# Patient Record
Sex: Male | Born: 1975 | Race: White | Hispanic: No | Marital: Single | Smoking: Current every day smoker
Health system: Southern US, Community
[De-identification: ages and names within clinical notes are randomized; demographics above are authoritative.]

## PROBLEM LIST (undated history)

## (undated) DIAGNOSIS — J45909 Unspecified asthma, uncomplicated: Secondary | ICD-10-CM

## (undated) DIAGNOSIS — M1990 Unspecified osteoarthritis, unspecified site: Secondary | ICD-10-CM

## (undated) DIAGNOSIS — J449 Chronic obstructive pulmonary disease, unspecified: Secondary | ICD-10-CM

## (undated) DIAGNOSIS — M5430 Sciatica, unspecified side: Secondary | ICD-10-CM

---

## 2004-01-15 ENCOUNTER — Emergency Department: Payer: Self-pay | Admitting: Emergency Medicine

## 2004-04-02 ENCOUNTER — Emergency Department: Payer: Self-pay | Admitting: Emergency Medicine

## 2004-06-30 ENCOUNTER — Emergency Department: Payer: Self-pay | Admitting: Emergency Medicine

## 2004-09-04 ENCOUNTER — Emergency Department: Payer: Self-pay | Admitting: Emergency Medicine

## 2007-01-14 IMAGING — CR DG CHEST 2V
1 series · 2 of 2 positions shown · non-contrast
Comparison: none

REASON FOR EXAM: cough  pt in rm 17
COMMENTS:

PROCEDURE:     DXR - DXR CHEST PA (OR AP) AND LATERAL  - September 04, 2004 [DATE]
RESULT:     PA and lateral view reveals the cardiomediastinal structures to
be within normal limits.  The lung fields are clear. Vascularity is within
normal limits.  No effusions or pneumothoraces are noted.

[Series 355: postero_anterior · 0.11mm/px · 2 of 2 slices shown]
[im 1/2]
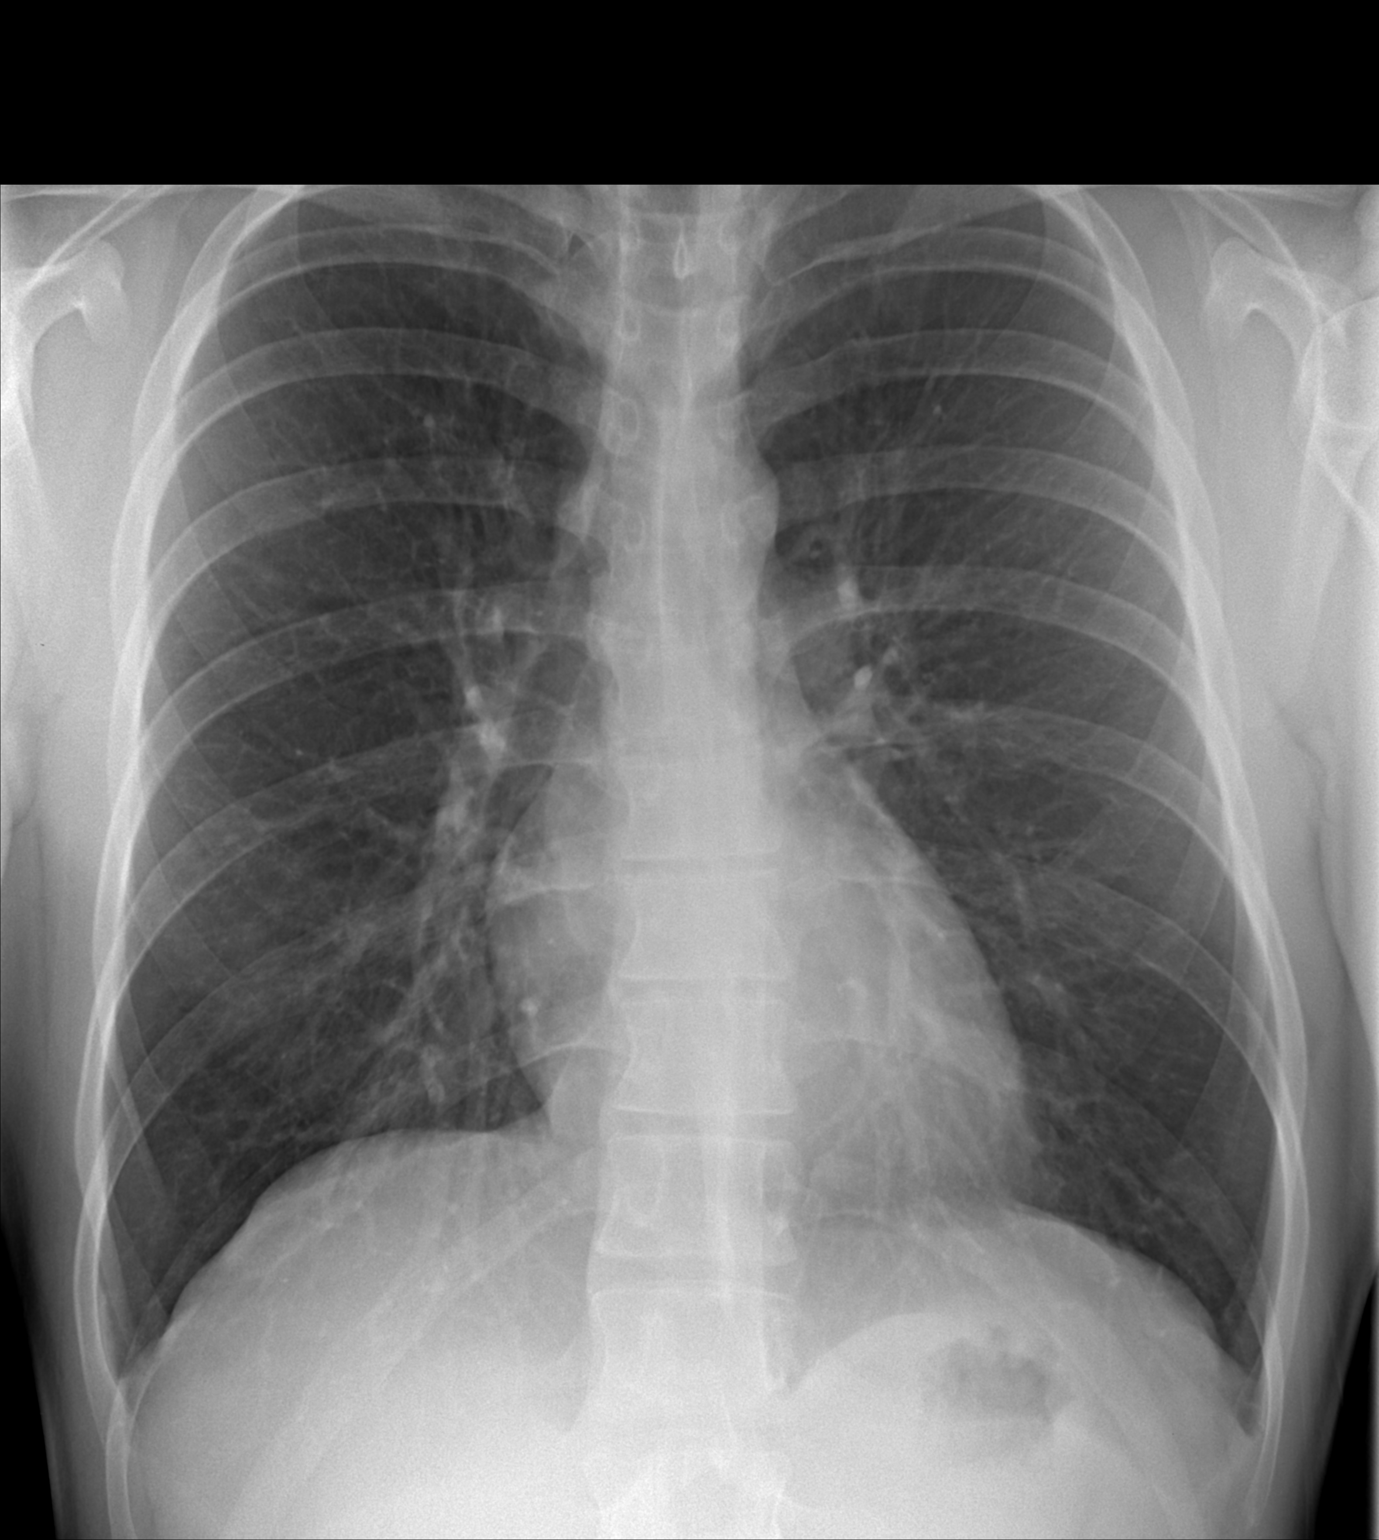
[im 2/2]
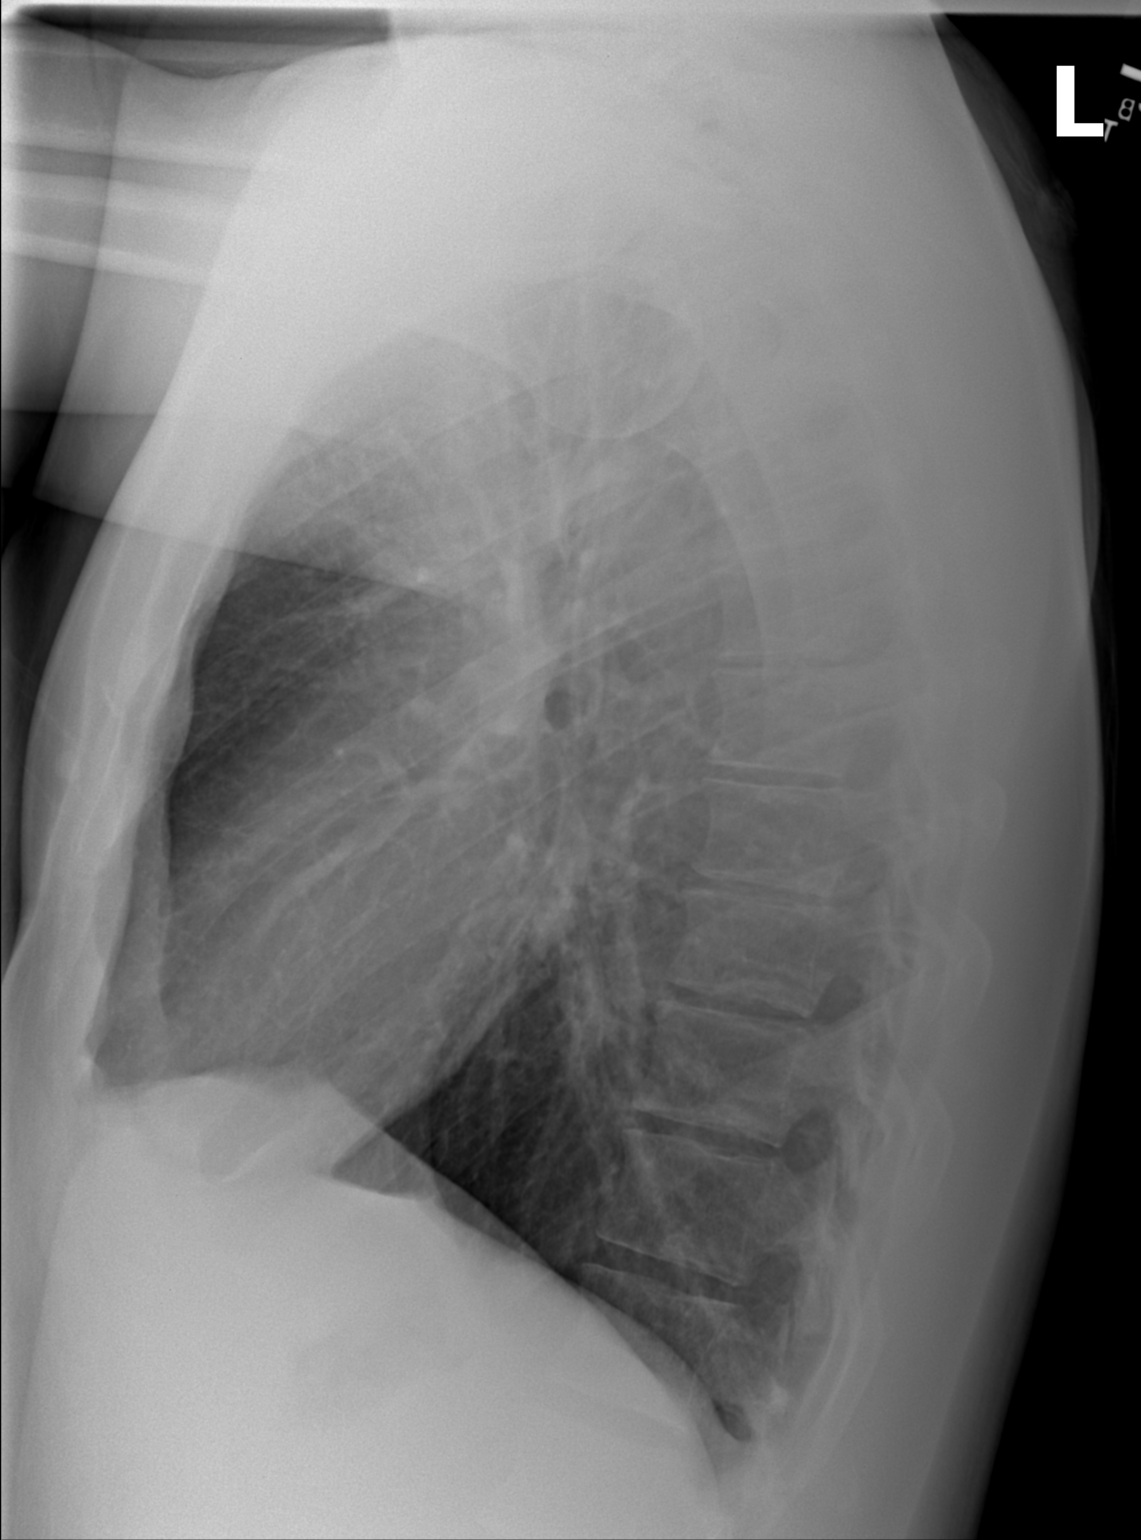

[2 of 2 positions shown; findings below may reference images not displayed]

IMPRESSION: 1)No significant abnormalities seen of the chest.

## 2008-02-01 ENCOUNTER — Emergency Department: Payer: Self-pay | Admitting: Emergency Medicine

## 2008-08-25 ENCOUNTER — Emergency Department: Payer: Self-pay | Admitting: Emergency Medicine

## 2009-08-03 ENCOUNTER — Emergency Department: Payer: Self-pay | Admitting: Emergency Medicine

## 2011-12-13 IMAGING — CR DG HEEL 2V*R*
1 series · 2 of 2 positions shown · non-contrast
Comparison: none

REASON FOR EXAM: increasing plantar heel pain
COMMENTS:   LMP: (Male)

PROCEDURE:     DXR - DXR CALCANEOUS - HEEL RIGHT  - August 03, 2009 [DATE]
RESULT:     No fracture, dislocation or other acute bony abnormality is
identified. No lytic or blastic lesions of the calcaneus are seen.

[Series 1: view not recorded · 0.17mm/px · 2 of 2 slices shown]
[im 1/2]
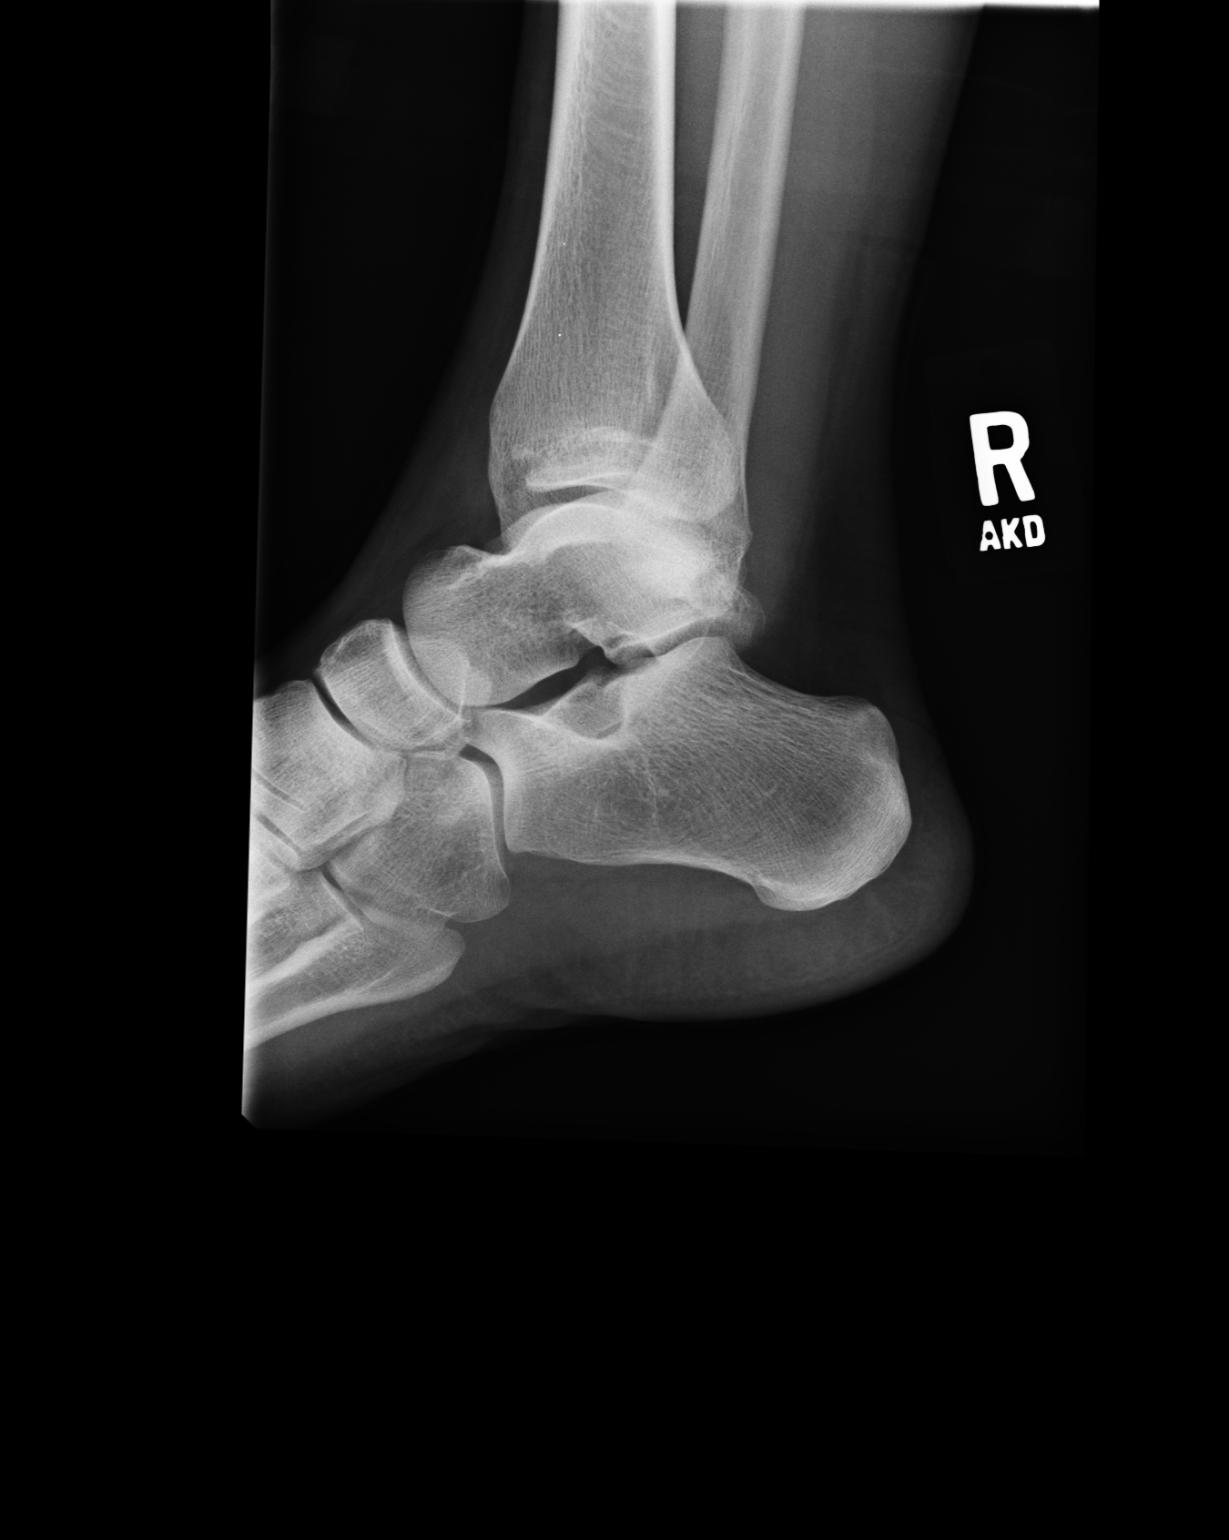
[im 2/2]
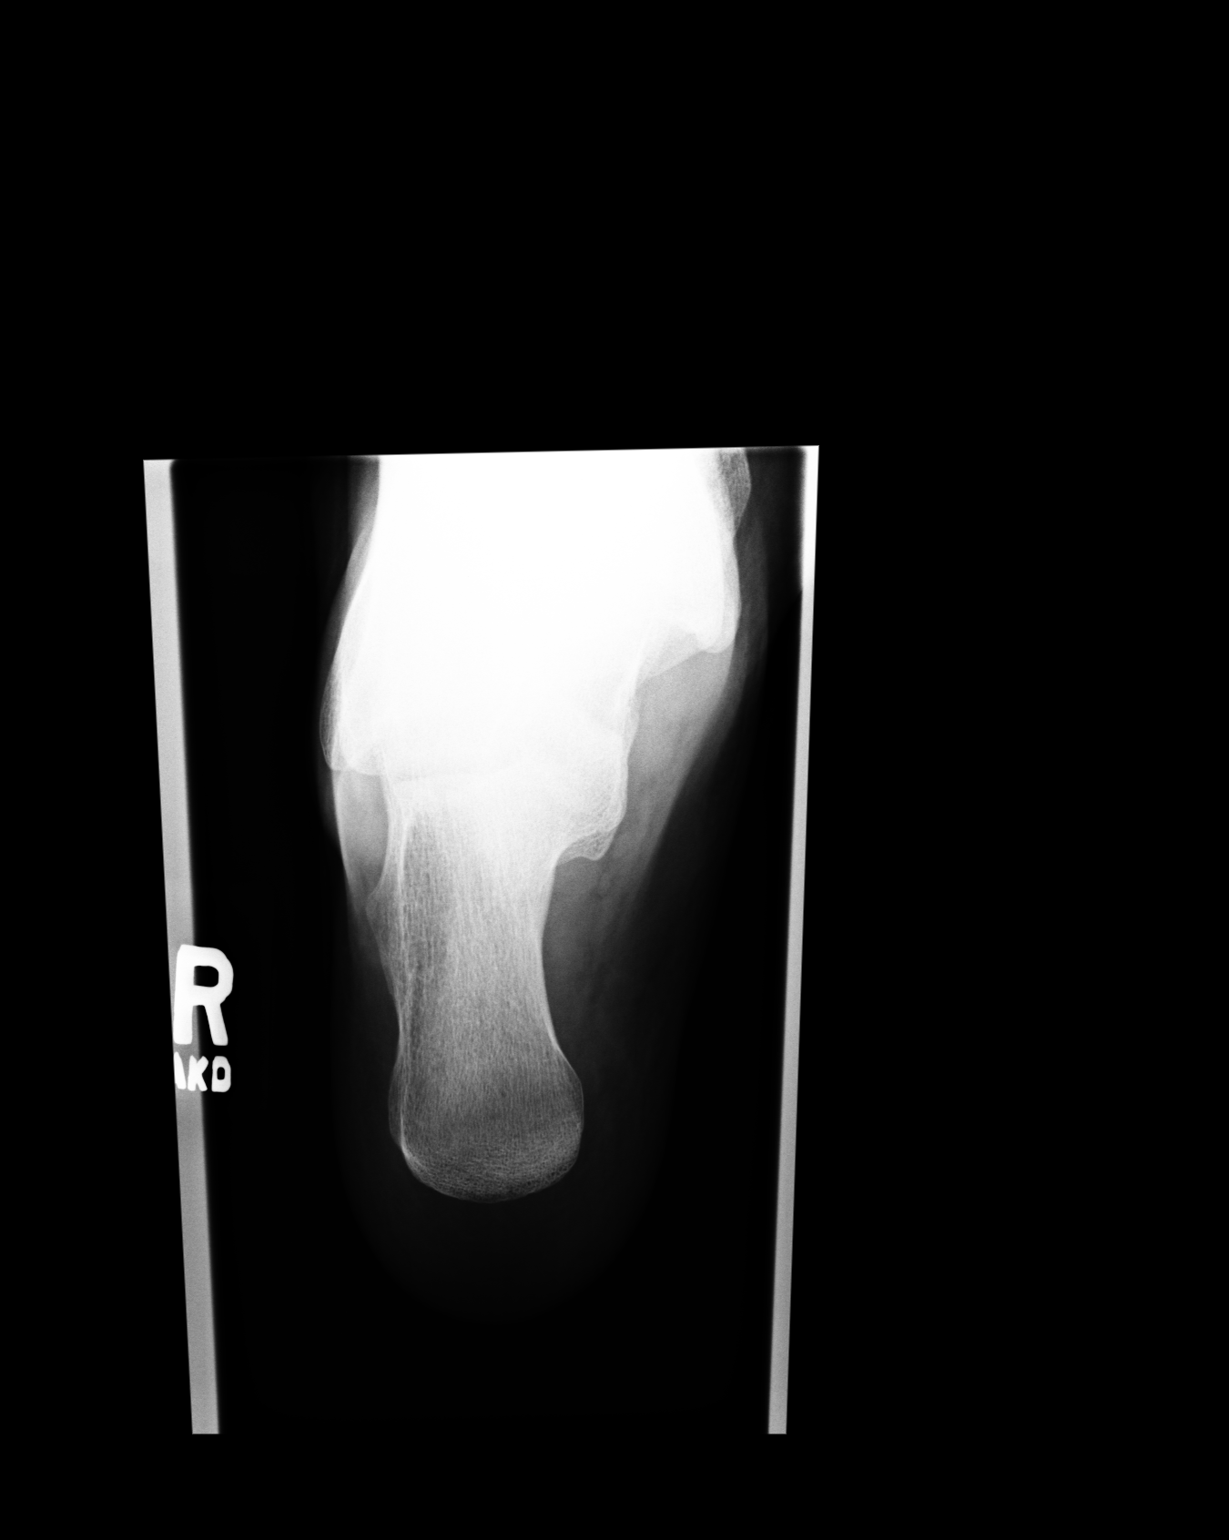

[2 of 2 positions shown; findings below may reference images not displayed]

IMPRESSION: 1.     No significant abnormalities are noted.

## 2011-12-26 ENCOUNTER — Emergency Department: Payer: Self-pay | Admitting: Emergency Medicine

## 2013-04-18 ENCOUNTER — Emergency Department: Payer: Self-pay | Admitting: Emergency Medicine

## 2013-04-18 LAB — RAPID INFLUENZA A&B ANTIGENS (ARMC ONLY)

## 2013-04-21 LAB — BETA STREP CULTURE(ARMC)

## 2017-01-05 ENCOUNTER — Encounter: Payer: Self-pay | Admitting: Emergency Medicine

## 2017-01-05 ENCOUNTER — Emergency Department
Admission: EM | Admit: 2017-01-05 | Discharge: 2017-01-05 | Disposition: A | Discharge status: Home / Self Care | Payer: No Typology Code available for payment source | Attending: Emergency Medicine | Admitting: Emergency Medicine

## 2017-01-05 DIAGNOSIS — J45909 Unspecified asthma, uncomplicated: Secondary | ICD-10-CM | POA: Insufficient documentation

## 2017-01-05 DIAGNOSIS — Y92410 Unspecified street and highway as the place of occurrence of the external cause: Secondary | ICD-10-CM | POA: Diagnosis not present

## 2017-01-05 DIAGNOSIS — M7918 Myalgia, other site: Secondary | ICD-10-CM | POA: Diagnosis not present

## 2017-01-05 DIAGNOSIS — Y939 Activity, unspecified: Secondary | ICD-10-CM | POA: Insufficient documentation

## 2017-01-05 DIAGNOSIS — J449 Chronic obstructive pulmonary disease, unspecified: Secondary | ICD-10-CM | POA: Insufficient documentation

## 2017-01-05 DIAGNOSIS — S63633A Sprain of interphalangeal joint of left middle finger, initial encounter: Secondary | ICD-10-CM | POA: Diagnosis not present

## 2017-01-05 DIAGNOSIS — S6982XA Other specified injuries of left wrist, hand and finger(s), initial encounter: Secondary | ICD-10-CM | POA: Diagnosis present

## 2017-01-05 DIAGNOSIS — Y999 Unspecified external cause status: Secondary | ICD-10-CM | POA: Diagnosis not present

## 2017-01-05 DIAGNOSIS — F17210 Nicotine dependence, cigarettes, uncomplicated: Secondary | ICD-10-CM | POA: Insufficient documentation

## 2017-01-05 HISTORY — DX: Sciatica, unspecified side: M54.30

## 2017-01-05 HISTORY — DX: Chronic obstructive pulmonary disease, unspecified: J44.9

## 2017-01-05 HISTORY — DX: Unspecified osteoarthritis, unspecified site: M19.90

## 2017-01-05 HISTORY — DX: Unspecified asthma, uncomplicated: J45.909

## 2017-01-05 MED ORDER — CYCLOBENZAPRINE HCL 5 MG PO TABS: 5.0000 mg | ORAL_TABLET | Freq: Three times a day (TID) | ORAL | 0 refills | Status: AC | PRN
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
## 2017-01-05 NOTE — Discharge Instructions (Signed)
Your exam is essentially normal. Take the prescription meds as directed. Follow-up with [facility] as needed.

## 2017-08-19 ENCOUNTER — Emergency Department
Admission: EM | Admit: 2017-08-19 | Discharge: 2017-08-19 | Disposition: A | Discharge status: Home / Self Care | Payer: Self-pay

## 2017-08-19 ENCOUNTER — Encounter: Payer: Self-pay | Admitting: Emergency Medicine
